# Patient Record
Sex: Female | Born: 1995 | Race: White | Hispanic: No | Marital: Single | Smoking: Never smoker
Health system: Southern US, Community
[De-identification: ages and names within clinical notes are randomized; demographics above are authoritative.]

## PROBLEM LIST (undated history)

## (undated) DIAGNOSIS — B2790 Infectious mononucleosis, unspecified without complication: Secondary | ICD-10-CM

## (undated) HISTORY — DX: Infectious mononucleosis, unspecified without complication: B27.90

---
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
## 2016-03-11 ENCOUNTER — Ambulatory Visit (INDEPENDENT_AMBULATORY_CARE_PROVIDER_SITE_OTHER): Payer: Managed Care, Other (non HMO) | Admitting: Family Medicine

## 2016-03-11 VITALS — BP 100/76 | HR 72 | Temp 98.2°F | Ht 63.0 in | Wt 148.2 lb

## 2016-03-11 DIAGNOSIS — J0100 Acute maxillary sinusitis, unspecified: Secondary | ICD-10-CM | POA: Diagnosis not present

## 2016-03-11 DIAGNOSIS — H66002 Acute suppurative otitis media without spontaneous rupture of ear drum, left ear: Secondary | ICD-10-CM | POA: Diagnosis not present

## 2016-03-11 MED ORDER — CEFDINIR 300 MG PO CAPS: 300.0000 mg | ORAL_CAPSULE | Freq: Two times a day (BID) | ORAL | 0 refills | Status: AC

## 2016-03-11 MED ORDER — IPRATROPIUM BROMIDE 0.03 % NA SOLN: 2.0000 | Freq: Two times a day (BID) | NASAL | 0 refills | Status: AC

## 2016-03-11 NOTE — Progress Notes (Signed)
Patient ID: Tonya Griffith, female    DOB: 30-May-1995, 20 y.o.   MRN: [id number]  PCP: Tonya Luzalone, Gregory, Tonya Griffith  Chief Complaint  Patient presents with  . Sore Throat    Sin drainage  x 4-5 days  . Cough  . Otalgia    Subjective:   HPI 20 year old female presents for evaluation of sinus congestion, cough and ear pain time 4-5 days. Patient is familiar to to [facility]. She reports symptoms began as a sore throat and later progressed to non-productive wet cough. Uncertain of fever. Reports bilateral upper face tenderness, left ear pain, and increased nasal congestion over the coarse of the illness. Intermittent headache started about 1 day ago, most recently today she's developed a hoarse voice. Denies chest tenderness with cough, nausea, or vomiting. She hasn't taken any medication for symptom relief.  Social History   Social History  . Marital status: Single    Spouse name: N/A  . Number of children: 0  . Years of education: 14   Occupational History  . Student    Social History Main Topics  . Smoking status: Never Smoker  . Smokeless tobacco: Never Used  . Alcohol use 0.0 oz/week  . Drug use: No  . Sexual activity: Not on file   Other Topics Concern  . Not on file   Social History Narrative  . No narrative on file   No family history on file.  Review of Systems See HPI  Patient Active Problem List   Diagnosis Date Noted  . Peroneal tendon injury 08/10/2015  . Drug eruption 02/25/2015  . Fever, unspecified 02/25/2015  . Infectious mononucleosis 02/25/2015  . Anemia 02/25/2015  . 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 Authorizing Provider  ibuprofen (ADVIL,MOTRIN) 600 MG tablet Take 600 mg by mouth every 6 (six) hours as needed.   Yes Historical Provider, MD     Allergies  Allergen Reactions  . Amoxicillin Rash     Objective:  Physical Exam  Constitutional: She is oriented to person, place,  and time. She appears well-developed and well-nourished.  HENT:  Head: Normocephalic and atraumatic.  Right Ear: Hearing, tympanic membrane, external ear and ear canal normal.  Left Ear: Hearing and external ear normal. No drainage. Tympanic membrane is erythematous. No decreased hearing is noted.  Nose: Mucosal edema and sinus tenderness present. Right sinus exhibits maxillary sinus tenderness. Left sinus exhibits maxillary sinus tenderness.  Mouth/Throat: No oropharyngeal exudate, posterior oropharyngeal edema or tonsillar abscesses.  Eyes: Conjunctivae and EOM are normal. Pupils are equal, round, and reactive to light.  Neck: Normal range of motion. Neck supple.  Cardiovascular: Normal rate, regular rhythm, normal heart sounds and intact distal pulses.   Pulmonary/Chest: Effort normal and breath sounds normal.  Musculoskeletal: Normal range of motion.  Neurological: She is alert and oriented to person, place, and time. She has normal reflexes.  Skin: Skin is warm and dry.  Psychiatric: She has a normal mood and affect. Her behavior is normal. Judgment and thought content normal.    Vitals:   03/11/16 1312  BP: 100/76  Pulse: 72  Temp: 98.2 F (36.8 C)   Assessment & Plan:  1. Acute maxillary sinusitis, recurrence not specified 2. Acute suppurative otitis media of left ear without spontaneous rupture of tympanic membrane, recurrence not specified  Plan: Cefdinir (Omnicef) 300 mg, 2 times daily for 10 days. Ipratropium (Atrovent) 0.03% nasal spray 2  sprays, 2 times daily   Follow-up as needed.  Godfrey Pick. Tiburcio Pea, MSN, Tonya Griffith-C [facility]

## 2016-03-11 NOTE — Patient Instructions (Addendum)
Start Cefdinir 300 mg , 1 tablet, twice daily.  Use Atrovent 2 puffs, twice daily for nasal congestion.  Return for care as needed.  Tonya Griffith. Tiburcio Pea, MSN, FNP-C [facility]  IF you received an x-ray today, you will receive an invoice from [facility]. Please contact [facility] at [phone] with questions or concerns regarding your invoice.   IF you received labwork today, you will receive an invoice from [employer]. Please contact [facility] at [phone] with questions or concerns regarding your invoice.   Our billing staff will not be able to assist you with questions regarding bills from these companies.  You will be contacted with the lab results as soon as they are available. The fastest way to get your results is to activate your My Chart account. Instructions are located on the last page of this paperwork. If you have not heard from [place] regarding the results in 2 weeks, please contact this office.    Otitis Media With Effusion Otitis media with effusion is the presence of fluid in the middle ear. This is a common problem in children, which often follows ear infections. It may be present for weeks or longer after the infection. Unlike an acute ear infection, otitis media with effusion refers only to fluid behind the ear drum and not infection. Children with repeated ear and sinus infections and allergy problems are the most likely to get otitis media with effusion. CAUSES  The most frequent cause of the fluid buildup is dysfunction of the eustachian tubes. These are the tubes that drain fluid in the ears to the back of the nose (nasopharynx). SYMPTOMS   The main symptom of this condition is hearing loss. As a result, you or your child may:  Listen to the TV at a loud volume.  Not respond to questions.  Ask "what" often when spoken to.  Mistake or confuse one sound or word for  another.  There may be a sensation of fullness or pressure but usually not pain. DIAGNOSIS   Your health care provider will diagnose this condition by examining you or your child's ears.  Your health care provider may test the pressure in you or your child's ear with a tympanometer.  A hearing test may be conducted if the problem persists. TREATMENT   Treatment depends on the duration and the effects of the effusion.  Antibiotics, decongestants, nose drops, and cortisone-type drugs (tablets or nasal spray) may not be helpful.  Children with persistent ear effusions may have delayed language or behavioral problems. Children at risk for developmental delays in hearing, learning, and speech may require referral to a specialist earlier than children not at risk.  You or your child's health care provider may suggest a referral to an ear, nose, and throat surgeon for treatment. The following may help restore normal hearing:  Drainage of fluid.  Placement of ear tubes (tympanostomy tubes).  Removal of adenoids (adenoidectomy). HOME CARE INSTRUCTIONS   Avoid secondhand smoke.  Infants who are breastfed are less likely to have this condition.  Avoid feeding infants while they are lying flat.  Avoid known environmental allergens.  Avoid people who are sick. SEEK MEDICAL CARE IF:   Hearing is not better in 3 months.  Hearing is worse.  Ear pain.  Drainage from the ear.  Dizziness. MAKE SURE YOU:   Understand these instructions.  Will watch your condition.  Will get help right away if you are  not doing well or get worse.   This information is not intended to replace advice given to you by your health care provider. Make sure you discuss any questions you have with your health care provider.   Document Released: 06/12/2004 Document Revised: 05/26/2014 Document Reviewed: 11/30/2012 Elsevier Interactive Patient Education [employer]2016 [employer]

## 2016-11-09 IMAGING — DX DG ANKLE COMPLETE 3+V*L*
3 series · 3 of 3 positions shown · non-contrast
Comparison: None.

CLINICAL DATA: Peroneal tendon injury left. Subsequent encounter.
Rule out fracture.

EXAM:
LEFT ANKLE COMPLETE - 3+ VIEW

[ankle ap]
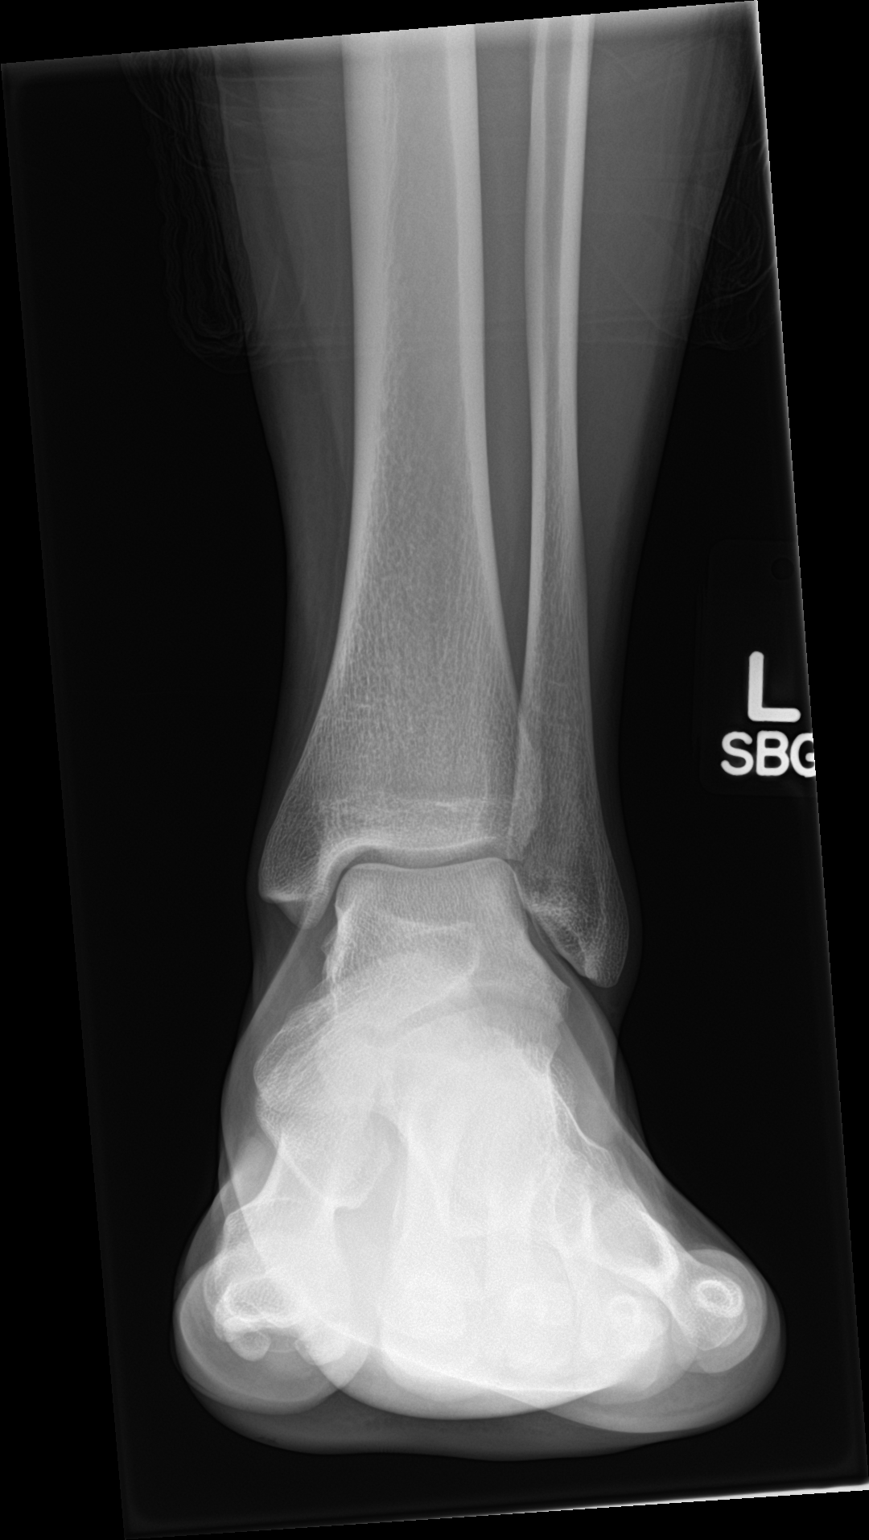

[ankle obl]
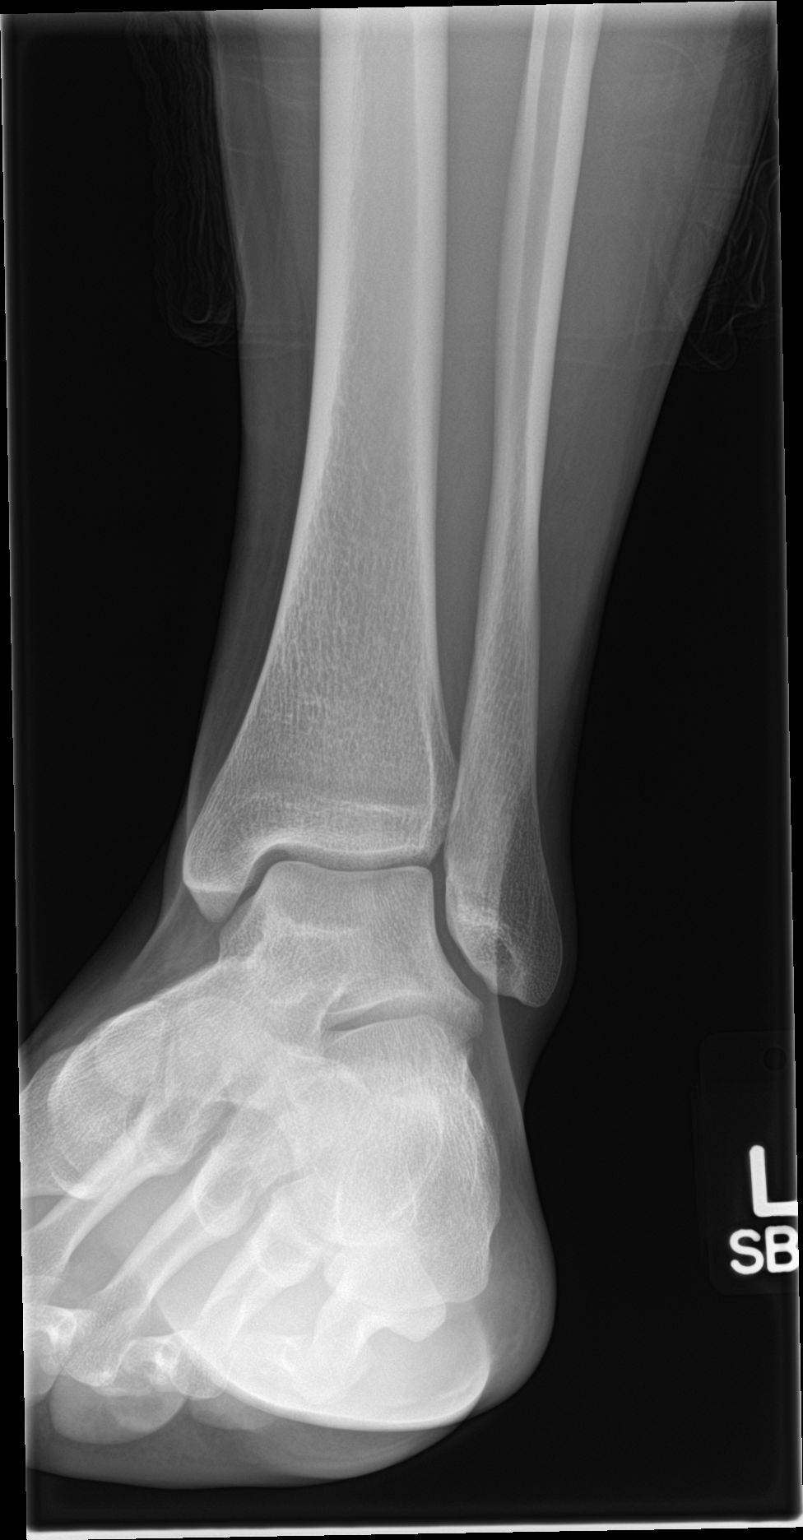

[ankle lat]
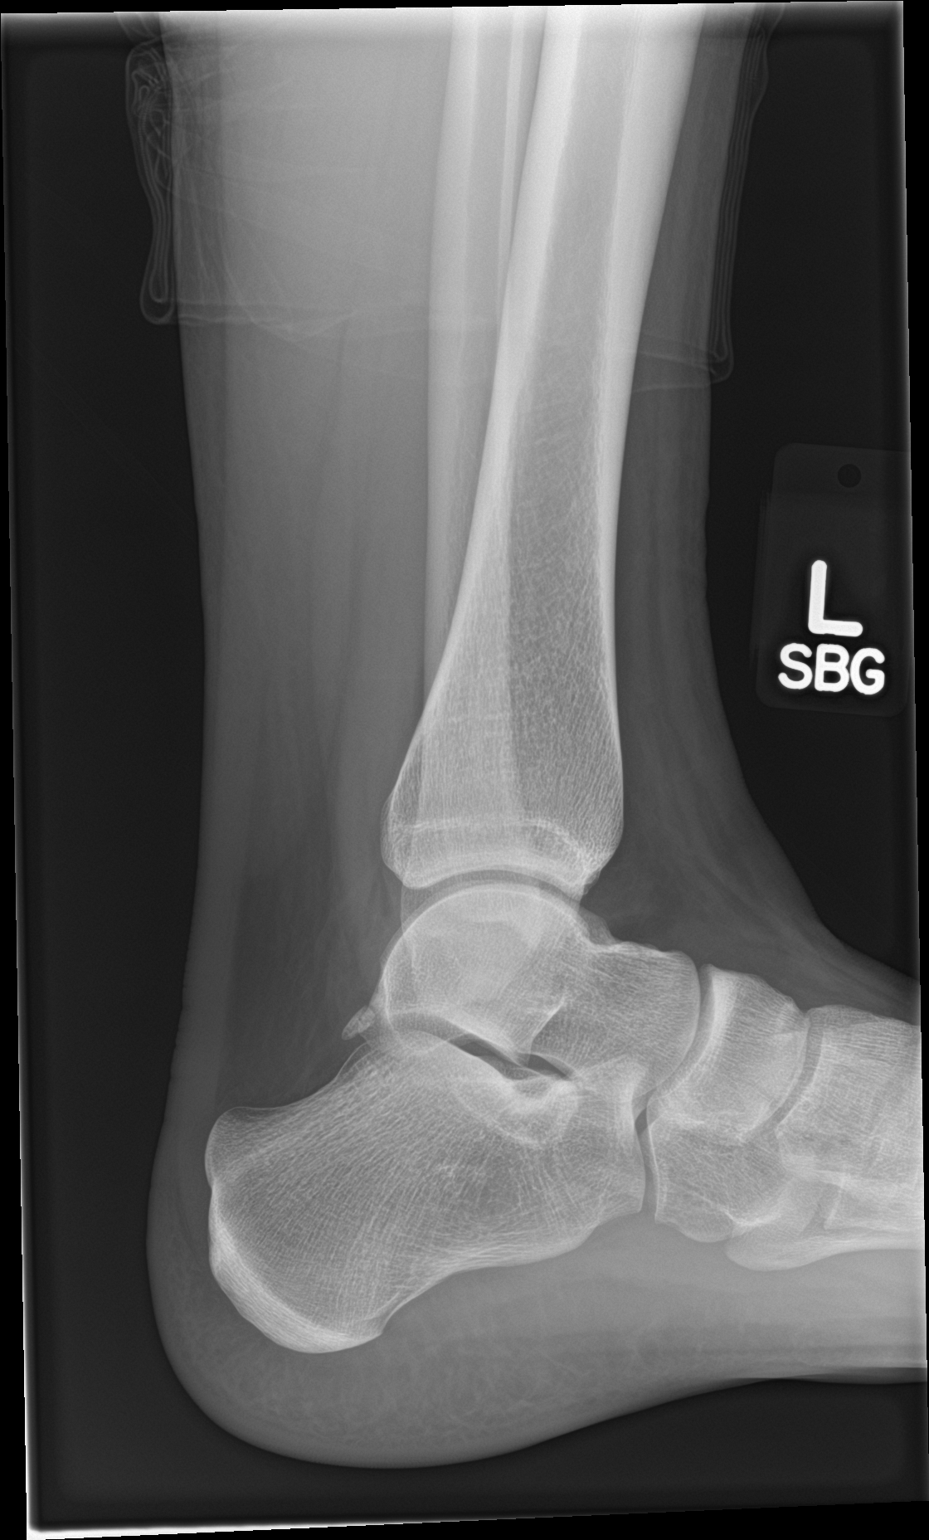

[3 of 3 positions shown; findings below may reference images not displayed]

FINDINGS: There is no evidence of fracture, dislocation, or joint effusion.
There is no evidence of arthropathy or other focal bone abnormality.
Soft tissues are unremarkable.
IMPRESSION: Negative.
